# Patient Record
Sex: Female | Born: 1990 | Race: Black or African American | Hispanic: No | Marital: Single | Smoking: Current some day smoker
Health system: Southern US, Community
[De-identification: ages and names within clinical notes are randomized; demographics above are authoritative.]

## PROBLEM LIST (undated history)

## (undated) DIAGNOSIS — G43909 Migraine, unspecified, not intractable, without status migrainosus: Secondary | ICD-10-CM

---
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
## 2013-09-08 ENCOUNTER — Encounter (HOSPITAL_COMMUNITY): Payer: Self-pay | Admitting: Emergency Medicine

## 2013-09-08 ENCOUNTER — Emergency Department (HOSPITAL_COMMUNITY)
Admission: EM | Admit: 2013-09-08 | Discharge: 2013-09-08 | Disposition: A | Discharge status: Home / Self Care | Payer: Medicaid Other | Attending: Emergency Medicine | Admitting: Emergency Medicine

## 2013-09-08 DIAGNOSIS — G43909 Migraine, unspecified, not intractable, without status migrainosus: Secondary | ICD-10-CM | POA: Insufficient documentation

## 2013-09-08 HISTORY — DX: Migraine, unspecified, not intractable, without status migrainosus: G43.909

## 2013-09-08 LAB — I-STAT CHEM 8, ED
BUN: 4 mg/dL — AB (ref 6–23)
CALCIUM ION: 1.23 mmol/L (ref 1.12–1.23)
CREATININE: 0.7 mg/dL (ref 0.50–1.10)
Chloride: 102 mEq/L (ref 96–112)
GLUCOSE: 86 mg/dL (ref 70–99)
HCT: 44 % (ref 36.0–46.0)
HEMOGLOBIN: 15 g/dL (ref 12.0–15.0)
POTASSIUM: 4 meq/L (ref 3.7–5.3)
SODIUM: 140 meq/L (ref 137–147)
TCO2: 26 mmol/L (ref 0–100)

## 2013-09-08 MED ORDER — DIPHENHYDRAMINE HCL 50 MG/ML IJ SOLN
25.0000 mg | Freq: Once | INTRAMUSCULAR | Status: AC
  Administered 2013-09-08: 25 mg via INTRAVENOUS
  Filled 2013-09-08: qty 1

## 2013-09-08 MED ORDER — METOCLOPRAMIDE HCL 5 MG/ML IJ SOLN
10.0000 mg | Freq: Once | INTRAMUSCULAR | Status: AC
  Administered 2013-09-08: 10 mg via INTRAVENOUS
  Filled 2013-09-08: qty 2

## 2013-09-08 NOTE — ED Notes (Signed)
Pt c/o migraine lasting 3 days that is not improved with ibuprofen.  Pt states this is worse than her typical headache.  C/o vomiting, photophobia and blurred vision.

## 2013-09-08 NOTE — ED Provider Notes (Signed)
CSN: [id number]     Arrival date & time 09/08/13  1436 History   First MD Initiated Contact with Patient 09/08/13 1552     Chief Complaint  Patient presents with  . Migraine     (Consider location/radiation/quality/duration/timing/severity/associated sxs/prior Treatment) Patient is a 23 y.o. female presenting with migraines. The history is provided by the patient. No language interpreter was used.  Migraine This is a new problem. The current episode started in the past 7 days. The problem occurs constantly. The problem has been gradually worsening. Associated symptoms include nausea, a visual change and vomiting. She has tried NSAIDs for the symptoms. The treatment provided no relief.    Past Medical History  Diagnosis Date  . Migraines    History reviewed. No pertinent past surgical history. No family history on file. History  Substance Use Topics  . Smoking status: Never Smoker   . Smokeless tobacco: Not on file  . Alcohol Use: No   OB History   Grav Para Term Preterm Abortions TAB SAB Ect Mult Living                 Review of Systems  Eyes: Positive for photophobia.  Gastrointestinal: Positive for nausea and vomiting.  Musculoskeletal: Negative for neck stiffness.  All other systems reviewed and are negative.      Allergies  Review of patient's allergies indicates no known allergies.  Home Medications  No current outpatient prescriptions on file. BP 105/75  Pulse 91  Temp(Src) 98.1 F (36.7 C) (Oral)  Resp 18  Ht 5\' 6"  (1.676 m)  Wt 229 lb (103.874 kg)  BMI 36.98 kg/m2  SpO2 100%  LMP 08/12/2013 Physical Exam  Nursing note and vitals reviewed. Constitutional: She is oriented to person, place, and time. She appears well-nourished.  HENT:  Head: Normocephalic.  Eyes: EOM are normal. Pupils are equal, round, and reactive to light. Right eye exhibits no nystagmus. Left eye exhibits no nystagmus.  Neck: Normal range of motion.  Cardiovascular: Normal  rate, regular rhythm and normal heart sounds.   Pulmonary/Chest: Effort normal and breath sounds normal.  Abdominal: Soft. Bowel sounds are normal.  Musculoskeletal: Normal range of motion. She exhibits no edema and no tenderness.  Lymphadenopathy:    She has no cervical adenopathy.  Neurological: She is alert and oriented to person, place, and time. She has normal strength. No cranial nerve deficit or sensory deficit. Coordination normal. GCS eye subscore is 4. GCS verbal subscore is 5. GCS motor subscore is 6.    ED Course  Procedures (including critical care time) Labs Review Labs Reviewed  POC URINE PREG, ED  I-STAT CHEM 8, ED   Imaging Review No results found.   EKG Interpretation None     Headache resolved after medication.  No concerning neurologic findings. MDM   Final diagnoses:  None    Headache.    Jimmye Normanavid John Fallen Crisostomo, NP 09/09/13 [phone]

## 2013-09-08 NOTE — Discharge Instructions (Signed)
Migraine Headache °A migraine headache is an intense, throbbing pain on one or both sides of your head. A migraine can last for 30 minutes to several hours. °CAUSES  °The exact cause of a migraine headache is not always known. However, a migraine may be caused when nerves in the brain become irritated and release chemicals that cause inflammation. This causes pain. °Certain things may also trigger migraines, such as: °· Alcohol. °· Smoking. °· Stress. °· Menstruation. °· Aged cheeses. °· Foods or drinks that contain nitrates, glutamate, aspartame, or tyramine. °· Lack of sleep. °· Chocolate. °· Caffeine. °· Hunger. °· Physical exertion. °· Fatigue. °· Medicines used to treat chest pain (nitroglycerine), birth control pills, estrogen, and some blood pressure medicines. °SIGNS AND SYMPTOMS °· Pain on one or both sides of your head. °· Pulsating or throbbing pain. °· Severe pain that prevents daily activities. °· Pain that is aggravated by any physical activity. °· Nausea, vomiting, or both. °· Dizziness. °· Pain with exposure to bright lights, loud noises, or activity. °· General sensitivity to bright lights, loud noises, or smells. °Before you get a migraine, you may get warning signs that a migraine is coming (aura). An aura may include: °· Seeing flashing lights. °· Seeing bright spots, halos, or zig-zag lines. °· Having tunnel vision or blurred vision. °· Having feelings of numbness or tingling. °· Having trouble talking. °· Having muscle weakness. °DIAGNOSIS  °A migraine headache is often diagnosed based on: °· Symptoms. °· Physical exam. °· A CT scan or MRI of your head. These imaging tests cannot diagnose migraines, but they can help rule out other causes of headaches. °TREATMENT °Medicines may be given for pain and nausea. Medicines can also be given to help prevent recurrent migraines.  °HOME CARE INSTRUCTIONS °· Only take over-the-counter or prescription medicines for pain or discomfort as directed by your  health care provider. The use of long-term narcotics is not recommended. °· Lie down in a dark, quiet room when you have a migraine. °· Keep a journal to find out what may trigger your migraine headaches. For example, write down: °· What you eat and drink. °· How much sleep you get. °· Any change to your diet or medicines. °· Limit alcohol consumption. °· Quit smoking if you smoke. °· Get 7 9 hours of sleep, or as recommended by your health care provider. °· Limit stress. °· Keep lights dim if bright lights bother you and make your migraines worse. °SEEK IMMEDIATE MEDICAL CARE IF:  °· Your migraine becomes severe. °· You have a fever. °· You have a stiff neck. °· You have vision loss. °· You have muscular weakness or loss of muscle control. °· You start losing your balance or have trouble walking. °· You feel faint or pass out. °· You have severe symptoms that are different from your first symptoms. °MAKE SURE YOU:  °· Understand these instructions. °· Will watch your condition. °· Will get help right away if you are not doing well or get worse. °Document Released: 06/09/2005 Document Revised: 03/30/2013 Document Reviewed: 02/14/2013 °ExitCare® Patient Information ©2014 [employer]. ° ° °Emergency Department Resource Guide °1) Find a Doctor and Pay Out of Pocket °Although you won't have to find out who is covered by your insurance plan, it is a good idea to ask around and get recommendations. You will then need to call the office and see if the doctor you have chosen will accept you as a new patient and what types of options   they offer for patients who are self-pay. Some doctors offer discounts or will set up payment plans for their patients who do not have insurance, but you will need to ask so you aren't surprised when you get to your appointment. ° °2) Contact Your Local Health Department °Not all health departments have doctors that can see patients for sick visits, but many do, so it is worth a call to see if  yours does. If you don't know where your local health department is, you can check in your phone book. The CDC also has a tool to help you locate your state's health department, and many state websites also have listings of all of their local health departments. ° °3) Find a Walk-in Clinic °If your illness is not likely to be very severe or complicated, you may want to try a walk in clinic. These are popping up all over the country in pharmacies, drugstores, and shopping centers. They're usually staffed by nurse practitioners or physician assistants that have been trained to treat common illnesses and complaints. They're usually fairly quick and inexpensive. However, if you have serious medical issues or chronic medical problems, these are probably not your best option. ° °No Primary Care Doctor: °- Call [facility] at  832-8000 - they can help you locate a primary care doctor that  accepts your insurance, provides certain services, etc. °- Physician Referral Service- 1-800-533-3463 ° °Chronic Pain Problems: °Organization         Address  Phone   Notes  °[facility]  (336) 297-2271 Patients need to be referred by their primary care doctor.  ° °Medication Assistance: °Organization         Address  Phone   Notes  °[place] [facility] [address] °[place], [place] [address] (336) 641-8030 --Must be a resident of [place] °-- Must have NO insurance coverage whatsoever (no Medicaid/ Medicare, etc.) °-- The pt. MUST have a primary care doctor that directs their care regularly and follows them in the community °  °[facility]  (866) 331-1348   °[employer]  (888) 892-1162   ° °Agencies that provide inexpensive medical care: °Organization         Address  Phone   Notes  °[facility]  (336) 832-8035   °[facility]    (336) 832-7272   °[facility] [address] °[place], [place] [address] (336) 832-4777    °[facility] [address], °[place] (336) 271-4999   °[facility]    (336) 373-0678   °[facility]    (336) 272-1050   °[facility] ° [address], [place] Phone:  (336) 832-4444, Fax:  (336) 832-4440 Hours of Operation:  9 am - 6 pm, M-F.  Also accepts Medicaid/Medicare and self-pay.  °[facility] ° [address],  Phone: (336) 832-3150, Fax: (336) 832-3151. Hours of Operation:  8:30 am - 5:30 pm, M-F.  Also accepts Medicaid and self-pay.  °[facility] [place] [address], [place] Phone: (336) 878-6027   °[facility] [address], [place], [place] (336)723-1848, Ext. 123 Mondays & Thursdays: 7-9 AM.  First 15 patients are seen on a first come, first serve basis. °  ° °Medicaid-accepting [place] Providers: ° °Organization         Address  Phone   Notes  °[facility] [address], [place] (336) 641-2100 Also accepts self-pay patients.  °[facility] [address], [place] ° (336) 856-9996   °[facility] [address], [place] (336) 288-8857   °[facility] [address] [place] [address], [place] (336) 299-7000   °Veita Bland [address], [place]  ° (336) 373-1557 Only accepts [place] Access Medicaid patients after they have their name applied to their card.  ° °Self-Pay (no insurance) in [place]: ° °Organization         Address  Phone   Notes  °Sickle Cell Patients, [facility] [address], [place] (336) 832-1970   °[facility] [address], [place] (336) 832-4400   °[facility] ° [address], [place] (336) 992-4800   °[facility]/Dr. Osei-Bonsu ° [address] [place] [address], [place] or [address], [place] (336)  841-8500 Phone number for both [place] and [place] locations is the same.  °[facility] [address], [place] (336) 299-0000   °[facility] [address] [place] [address], [place] or [address] (336) 852-7530 °(336) 878-2260   °[facility] [address], [place] (336) 350-1642, phone; (336) 294-5005, fax Sees patients 1st and 3rd Saturday of every month.  Must not qualify for public or private insurance (i.e. Medicaid, Medicare, [place] Health Choice, Veterans' Benefits) • Household income should be no more than 200% of the poverty level •The clinic cannot treat you if you are pregnant or think you are pregnant • Sexually transmitted diseases are not treated at the clinic.  ° ° °Dental Care: °Organization         Address  Phone  Notes  °[facility] [address], [place] (336) 641-6152 Accepts children up to age 21 who are enrolled in Medicaid or [place] Health Choice; pregnant women with a Medicaid card; and children who have applied for Medicaid or [place] Health Choice, but were declined, whose parents can pay a reduced fee at time of service.  °[facility] [place]  [address], [place] (336) 641-7733 Accepts children up to age 21 who are enrolled in Medicaid or [place] Health Choice; pregnant women with a Medicaid card; and children who have applied for Medicaid or [place] Health Choice, but were declined, whose parents can pay a reduced fee at time of service.  °[facility] ° [address],  (336) 641-4533 Patients are seen by appointment only. Walk-ins are not accepted. [facility] will see patients 18 years of age and older. °Monday - Tuesday (8am-5pm) °Most Wednesdays (8:30-5pm) °$30 per visit, cash only  °[facility] ° [address], [place] (336) 641-4533 Patients are seen by  appointment only. Walk-ins are not accepted. [facility] will see patients 18 years of age and older. °One Wednesday Evening (Monthly: Volunteer Based).  $30 per visit, cash only  °[facility]  (919) 537-3737 for adults; Children under age 4, call Graduate Pediatric Dentistry at (919) [phone]. Children aged 4-14, please call (919) [phone] to request a pediatric application. ° Dental services are provided in all areas of dental care including fillings, crowns and bridges, complete and partial dentures,   implants, gum treatment, root canals, and extractions. Preventive care is also provided. Treatment is provided to both adults and children. °Patients are selected via a lottery and there is often a waiting list. °  °[facility] [address], °[place] ° (336) 763-8833 [email or web address] °  °[facility] [address], [place], [place] (336)723-1848, Ext. 123 Second and Fourth Thursday of each month, opens at 6:30 AM; Clinic ends at 9 AM.  Patients are seen on a first-come first-served basis, and a limited number are seen during each clinic.  ° °[facility] ° [address], [place], [place] (336) 723-7904   Eligibility Requirements °You must have lived in [place], [place], or [place] counties for at least the last three months. °  You cannot be eligible for state or federal sponsored healthcare insurance, including Veterans Administration, Medicaid, or Medicare. °  You generally cannot be eligible for healthcare insurance through your employer.  °  How to apply: °Eligibility screenings are held every Tuesday and Wednesday afternoon from 1:00 pm until 4:00 pm. You do not need an appointment for the interview!  °[facility] [address], [place], [place] 336-631-2330   °[facility]  336-342-8273   °[facility]  336-703-3100   °[facility]  336-570-6415    ° °Behavioral Health Resources in the Community: °Intensive Outpatient Programs °Organization         Address  Phone  Notes  °[facility] [address], [place], [place] 336-878-6098   °[facility] [address], [place], [place] 336-832-9800   °[facility] [address], [place], [place] ° 336-882-2125   °[facility] [address],  °[place], [place] 1-800-853-5163 or 336-641-4981   °Substance Abuse Resources °Organization         Address  Phone  Notes  °[facility]  336-882-2125   °[facility]  336-784-9470   °[facility]  336-285-9073   °[facility]  336-845-3988   °Residential & Outpatient Substance Abuse Program  1-800-659-3381   °Psychological Services °Organization         Address  Phone  Notes  °[facility]  336- 832-9600   °[facility]  336- 378-7881   °[facility] [address], [place] 1-800-853-5163 or 336-641-4981   ° °Mobile Crisis Teams °Organization         Address  Phone  Notes  °[facility], Mobile Crisis Care Unit  1-877-626-1772   °Assertive °[facility] ° [address] [place], [place] 336-834-9664   °Sharon DeEsch [address] °[place] 336-554-5454   ° °Self-Help/Support Groups °Organization         Address  Phone             Notes  °[facility] - variety of support groups  336- 373-1402 Call for more information  °Narcotics Anonymous (NA), [facility] [address], °[place]  2 meetings at this location  ° °Residential Treatment Programs °Organization         Address  Phone  Notes  °[facility] [address],    °[place]  1-866-801-8205   °[facility] ° [address], [place], [place] 704-293-8524   °[facility] [address], [place] 336-845-3988 Admissions: 8am-3pm M-F  °[facility] [address]    °[place], [place] 336-841-1104   °[facility] [address], [place], [place] 336-379-7146   °[facility] [address]  °[place], [place] 336-285-9073   °[facility] - Intensive Outpatient [address], [place], [place] 336-852-3033   °[facility] [address]  °[place], [place] 1-877-615-2722 or 336-784-9470   °[facility] [address] [place], [place] 336-227-7417 Accepts Medicaid  °[facility] [address]  °[place] 1-800-659-3381 Substance Abuse/Addiction Treatment  ° °[place] Behavioral Health Resources °Organization         Address  Phone  Notes  °[facility]  (888) 581-9988   °Julie Brannon, PhD [address] [place], [place]   (336) 349-5553 or (336) 951-0000   °[facility]   [address] °[place], [place] (336) 349-4454   °[facility] [address], [place], [place] (336) 342-8316 Insurance/Medicaid/sponsorship through [facility]  °[facility] [address]                                    [place], [place] (336) 342-8316 Therapy/tele-psych/case  °[facility] [address]  ° [place], [place] (336) 349-2233    °Dr. Arfeen  (336) 349-4544   °[facility] [place]  [employer] [place] [facility] 1) [address], [place] °2) [address], [place] °3)  [address], [place] (336) 349-3220 °(336) 342-7768 ° °(336) 342-8140   °[place] [facility] (336) 342-1394 or (336) 342-3537 (After Hours)    ° ° ° ° °

## 2013-09-17 NOTE — ED Provider Notes (Signed)
Medical screening examination/treatment/procedure(s) were performed by non-physician practitioner and as supervising physician I was immediately available for consultation/collaboration.   EKG Interpretation None        Gavin PoundMichael Y. Oletta LamasGhim, MD 09/17/13 [id number]

## 2017-11-17 ENCOUNTER — Telehealth (HOSPITAL_BASED_OUTPATIENT_CLINIC_OR_DEPARTMENT_OTHER): Payer: Self-pay | Admitting: *Deleted

## 2017-11-17 ENCOUNTER — Emergency Department (HOSPITAL_BASED_OUTPATIENT_CLINIC_OR_DEPARTMENT_OTHER)
Admission: EM | Admit: 2017-11-17 | Discharge: 2017-11-17 | Disposition: A | Discharge status: Home / Self Care | Payer: Medicaid Other | Attending: Emergency Medicine | Admitting: Emergency Medicine

## 2017-11-17 ENCOUNTER — Encounter (HOSPITAL_BASED_OUTPATIENT_CLINIC_OR_DEPARTMENT_OTHER): Payer: Self-pay

## 2017-11-17 ENCOUNTER — Other Ambulatory Visit: Payer: Self-pay

## 2017-11-17 DIAGNOSIS — Z5321 Procedure and treatment not carried out due to patient leaving prior to being seen by health care provider: Secondary | ICD-10-CM | POA: Insufficient documentation

## 2017-11-17 DIAGNOSIS — O9989 Other specified diseases and conditions complicating pregnancy, childbirth and the puerperium: Secondary | ICD-10-CM | POA: Insufficient documentation

## 2017-11-17 DIAGNOSIS — R1030 Lower abdominal pain, unspecified: Secondary | ICD-10-CM | POA: Insufficient documentation

## 2017-11-17 LAB — URINALYSIS, MICROSCOPIC (REFLEX)

## 2017-11-17 LAB — URINALYSIS, ROUTINE W REFLEX MICROSCOPIC
Bilirubin Urine: NEGATIVE
Glucose, UA: NEGATIVE mg/dL
Hgb urine dipstick: NEGATIVE
Ketones, ur: 15 mg/dL — AB
Nitrite: NEGATIVE
PROTEIN: NEGATIVE mg/dL
pH: 6 (ref 5.0–8.0)

## 2017-11-17 LAB — PREGNANCY, URINE: PREG TEST UR: POSITIVE — AB

## 2017-11-17 NOTE — ED Notes (Signed)
Pt not located in room; gown is on bed; no pt belongings noted.

## 2017-11-17 NOTE — ED Provider Notes (Signed)
Patient presents to the ED for evaluation of lower abdominal pain.  Patient reports history of pregnancy approximately 7-[redacted] weeks pregnant.  Patient had lab work done in triage that showed a urinary tract infection and trichomonas in her urine.  I reviewed patient's prior medical record and she was seen last week at [facility] ER and given Keflex at that time.  I went to examine patient and she was not in the room.  Her gown was removed and appears the patient has left.  She had deformity by that she was leaving.  I will notify charge nurse to call patient to tell her that her urine was abnormal with trichomonas and that she needs to see an OB/GYN doctor to have this treated.  As this can be harmful to a pregnancy.  Patient will be encouraged to follow-up with a OB/GYN and return the ED with any worsening symptoms.  I was not able to evaluate patient.   Rise Mu, PA-C 11/17/17 1446    Rolan Bucco, MD 11/17/17 [phone]

## 2017-11-17 NOTE — ED Triage Notes (Signed)
C/o mid lower abd pain since 8am-denies n/v/d, urinary sx and vaginal d/c-NAD-steady gait

## 2018-11-09 ENCOUNTER — Telehealth: Payer: Medicaid Other | Admitting: Physician Assistant

## 2018-11-09 DIAGNOSIS — J029 Acute pharyngitis, unspecified: Secondary | ICD-10-CM | POA: Diagnosis not present

## 2018-11-09 DIAGNOSIS — M791 Myalgia, unspecified site: Secondary | ICD-10-CM

## 2018-11-09 NOTE — Progress Notes (Signed)
E-Visit for Corona Virus Screening  Based on your current symptoms, you may very well have the virus, however your symptoms are mild. Currently, not all patients are being tested. If the symptoms are mild and there is not a known exposure, performing the test is not indicated.  You have been enrolled in MyChart Home Monitoring for COVID-19. Daily you will receive a questionnaire within the MyChart website. Our COVID-19 response team will be monitoring your responses daily.   Coronavirus disease 2019 (COVID-19)is a respiratory illness that can spread from person to person. The virus that causes COVID-19 is a new virus that was first identified in the country of [place] but is now found in multiple other countries and has spread to the [place].  Symptoms associated with the virus are mild to severe fever, cough, and shortness of breath. There is currently no vaccine to protect against COVID-19, and there is no specific antiviral treatment for the virus.   To be considered HIGH RISK for Coronavirus (COVID-19), you have to meet the following criteria:  . Traveled to [place], [place], [place], [place] or [place]; or in the [place] to [place], [place], [place], or [place]; and have fever, cough, and shortness of breath within the last 2 weeks of travel OR  . Been in close contact with a person diagnosed with COVID-19 within the last 2 weeks and have fever, cough, and shortness of breath  . IF YOU DO NOT MEET THESE CRITERIA, YOU ARE CONSIDERED LOW RISK FOR COVID-19.   It is vitally important that if you feel that you have an infection such as this virus or any other virus that you stay home and away from places where you may spread it to others.  You should self-quarantine for 14 days if you have symptoms that could potentially be coronavirus and avoid contact with people age 43 and older.     You may also take acetaminophen (Tylenol) 1000 mg every 8 hours as needed for fever.   Reduce  your risk of any infection by using the same precautions used for avoiding the common cold or flu: Wash your hands often with soap and warm water for at least 20 seconds.  If soap and water are not readily available, use an alcohol-based hand sanitizer with at least 60% alcohol.  If coughing or sneezing, cover your mouth and nose by coughing or sneezing into the elbow areas of your shirt or coat, into a tissue or into your sleeve (not your hands). Avoid shaking hands with others and consider head nods or verbal greetings only.  Avoid touching your eyes,nose, or mouth with unwashed hands. Avoid close contact with people who are sick. Avoid places or events with large numbers of people in one location, like concerts or sporting events. Carefully consider travel plans you have or are making. If you are planning any travel outside or inside the [place], visit the CDC'sTravelers' Health webpagefor the latest health notices. If you have some symptoms but not all symptoms, continue to monitor at home and seek medical attention if your symptoms worsen. If you are having a medical emergency, call 911.  HOME CARE Only take medications as instructed by your medical team. Drink plenty of fluids and get plenty of rest. A steam or ultrasonic humidifier can help if you have congestion.   GET HELP RIGHT AWAY IF: You develop worsening fever. You become short of breath You cough up blood. Your symptoms become more severe MAKE SURE YOU  Understand these instructions. Will watch your condition. Will get help right away if you are not doing well or get worse.  Your e-visit answers were reviewed by a board certified advanced clinical practitioner to complete your personal care plan.  Depending on the condition, your plan could have included both over the counter or prescription medications.  If there is a problem please reply once you have received a response from your provider. Your safety is important to [place].   If you have drug allergies check your prescription carefully.    You can use MyChart to ask questions about today's visit, request a non-urgent call back, or ask for a work or school excuse for 24 hours related to this e-Visit. If it has been greater than 24 hours you will need to follow up with your provider, or enter a new e-Visit to address those concerns. You will get an e-mail in the next two days asking about your experience.  I hope that your e-visit has been valuable and will speed your recovery. Thank you for using e-visits.    ===View-only below this line===   ----- Message -----    From: [name]    Sent: 11/09/2018 10:48 AM EDT      To: E-Visit Mailing List Subject: E-Visit Submission: Flu Like Symptoms  E-Visit Submission: Flu Like Symptoms --------------------------------  Question: How long have you had flu like symptoms? Answer:   less than 48 hours  Question: Are you having any body aches or pain such as Answer:   Muscle pain            None  Question: Do you have a cough or sore throat? Answer:   I have a sore throat  Question: Are you in close contact with anyone who has similar symptoms ? Answer:   No  Question: Do you have a fever? Answer:   Yes, I have a low fever (lower than 101 degrees)  Question: Describe your sore throat: Answer:   Scratxhy  Question: How long have you had a sore throat? Answer:   1 day  Question: Do you have any tenderness or swelling in your neck? Answer:   No  Question: Are you short of breath? Answer:   No  Question: Do you have constant pain in your chest or abdomen? Answer:   No  Question: Are you able to keep liquids down? Answer:   Yes  Question: Have you experienced a seizure or loss of consciousness? Answer:   No  Question: Do you have a headache? Answer:   Yes  Question: Is there a rash? Answer:   No  Question: Are you over the age of 23? Answer:   No  Question: Are you treated for any of the  following conditions: Asthma, COPD, diabetes, renal failure on dialysis, AIDS, any neuromuscular disease that effects the clearing of secretions heart failure or heart disease? Answer:   No  Question: Have you received recent chemotherapy or are you taking a drug that may reduce your ability to fight infections for psoriasis, arthritis, or any other autoimmune condition? Answer:   No  Question: Do you have close contact with anyone who has been diagnosed with any of the conditions listed above? Answer:   Yes  Question: Are you pregnant? Answer:   I am confident that I am not pregnant  Question: Are you breastfeeding? Answer:   No  Question: Are your symptoms severe enough that you think or someone has told you that you  need to see a physician urgently? Answer:   No  Question: Are you allergic to Zanamivir or Oseltamivir (Tamiflu)? Answer:   No  Question: Are there children in your family or household that are under the age of 5? Answer:   Yes  Question: Please list your medication allergies that you may have ? (If 'none' , please list as 'none') Answer:   N/a  Question: Please list any additional comments  Answer:     A total of 5-10 minutes was spent evaluating this patients questionnaire and formulating a plan of care.

## 2019-08-11 ENCOUNTER — Encounter (HOSPITAL_BASED_OUTPATIENT_CLINIC_OR_DEPARTMENT_OTHER): Payer: Self-pay | Admitting: *Deleted

## 2019-08-11 ENCOUNTER — Other Ambulatory Visit: Payer: Self-pay

## 2019-08-11 ENCOUNTER — Emergency Department (HOSPITAL_BASED_OUTPATIENT_CLINIC_OR_DEPARTMENT_OTHER)
Admission: EM | Admit: 2019-08-11 | Discharge: 2019-08-11 | Disposition: A | Discharge status: Home / Self Care | Payer: Medicaid Other | Attending: Emergency Medicine | Admitting: Emergency Medicine

## 2019-08-11 DIAGNOSIS — Z3A01 Less than 8 weeks gestation of pregnancy: Secondary | ICD-10-CM | POA: Diagnosis not present

## 2019-08-11 DIAGNOSIS — R519 Headache, unspecified: Secondary | ICD-10-CM | POA: Insufficient documentation

## 2019-08-11 DIAGNOSIS — O219 Vomiting of pregnancy, unspecified: Secondary | ICD-10-CM | POA: Diagnosis not present

## 2019-08-11 DIAGNOSIS — R509 Fever, unspecified: Secondary | ICD-10-CM | POA: Diagnosis not present

## 2019-08-11 DIAGNOSIS — O26891 Other specified pregnancy related conditions, first trimester: Secondary | ICD-10-CM | POA: Insufficient documentation

## 2019-08-11 DIAGNOSIS — Z3201 Encounter for pregnancy test, result positive: Secondary | ICD-10-CM

## 2019-08-11 DIAGNOSIS — R110 Nausea: Secondary | ICD-10-CM

## 2019-08-11 LAB — COMPREHENSIVE METABOLIC PANEL
ALT: 15 U/L (ref 0–44)
AST: 17 U/L (ref 15–41)
Albumin: 3.9 g/dL (ref 3.5–5.0)
Alkaline Phosphatase: 75 U/L (ref 38–126)
Anion gap: 8 (ref 5–15)
BUN: 7 mg/dL (ref 6–20)
CO2: 24 mmol/L (ref 22–32)
Calcium: 9.2 mg/dL (ref 8.9–10.3)
Chloride: 101 mmol/L (ref 98–111)
Creatinine, Ser: 0.57 mg/dL (ref 0.44–1.00)
GFR calc Af Amer: >60 mL/min (ref >60)
GFR calc non Af Amer: >60 mL/min (ref >60)
Glucose, Bld: 86 mg/dL (ref 70–99)
Potassium: 3.6 mmol/L (ref 3.5–5.1)
Sodium: 133 mmol/L — ABNORMAL LOW (ref 135–145)
Total Bilirubin: 0.9 mg/dL (ref 0.3–1.2)
Total Protein: 7.7 g/dL (ref 6.5–8.1)

## 2019-08-11 LAB — CBC WITH DIFFERENTIAL/PLATELET
Abs Immature Granulocytes: 0.03 10*3/uL (ref 0.00–0.07)
Basophils Absolute: 0.1 10*3/uL (ref 0.0–0.1)
Basophils Relative: 1 %
Eosinophils Absolute: 0.2 10*3/uL (ref 0.0–0.5)
Eosinophils Relative: 2 %
HCT: 43.6 % (ref 36.0–46.0)
Hemoglobin: 13.5 g/dL (ref 12.0–15.0)
Immature Granulocytes: 0 %
Lymphocytes Relative: 18 %
Lymphs Abs: 2 10*3/uL (ref 0.7–4.0)
MCH: 26 pg (ref 26.0–34.0)
MCHC: 31 g/dL (ref 30.0–36.0)
MCV: 83.8 fL (ref 80.0–100.0)
Monocytes Absolute: 0.7 10*3/uL (ref 0.1–1.0)
Monocytes Relative: 7 %
Neutro Abs: 8.1 10*3/uL — ABNORMAL HIGH (ref 1.7–7.7)
Neutrophils Relative %: 72 %
Platelets: 354 10*3/uL (ref 150–400)
RBC: 5.2 MIL/uL — ABNORMAL HIGH (ref 3.87–5.11)
RDW: 14.8 % (ref 11.5–15.5)
WBC: 11 10*3/uL — ABNORMAL HIGH (ref 4.0–10.5)
nRBC: 0 % (ref 0.0–0.2)

## 2019-08-11 LAB — URINALYSIS, ROUTINE W REFLEX MICROSCOPIC
Bilirubin Urine: NEGATIVE
Glucose, UA: NEGATIVE mg/dL
Hgb urine dipstick: NEGATIVE
Ketones, ur: 40 mg/dL — AB
Leukocytes,Ua: NEGATIVE
Nitrite: NEGATIVE
Protein, ur: NEGATIVE mg/dL
Specific Gravity, Urine: >1.03 — ABNORMAL HIGH (ref 1.005–1.030)
pH: 6 (ref 5.0–8.0)

## 2019-08-11 LAB — LIPASE, BLOOD: Lipase: 17 U/L (ref 11–51)

## 2019-08-11 LAB — PREGNANCY, URINE: Preg Test, Ur: POSITIVE — AB

## 2019-08-11 LAB — HCG, QUANTITATIVE, PREGNANCY: hCG, Beta Chain, Quant, S: 147816 m[IU]/mL — ABNORMAL HIGH (ref <5)

## 2019-08-11 MED ORDER — ONDANSETRON 4 MG PO TBDP: 4.0000 mg | ORAL_TABLET | Freq: Three times a day (TID) | ORAL | 0 refills | Status: DC | PRN

## 2019-08-11 MED ORDER — SODIUM CHLORIDE 0.9 % IV BOLUS
1000.0000 mL | Freq: Once | INTRAVENOUS | Status: AC
  Administered 2019-08-11: 1000 mL via INTRAVENOUS

## 2019-08-11 MED ORDER — ONDANSETRON HCL 4 MG/2ML IJ SOLN
4.0000 mg | Freq: Once | INTRAMUSCULAR | Status: AC
  Administered 2019-08-11: 4 mg via INTRAVENOUS
  Filled 2019-08-11: qty 2

## 2019-08-11 NOTE — ED Provider Notes (Signed)
[facility] EMERGENCY DEPARTMENT Provider Note   CSN: [id number] Arrival date & time: 08/11/19  1734     History Chief Complaint  Patient presents with  . Nausea    Jenna Knight is a 29 y.o. female has no history of migraines who presents for evaluation of nausea/vomiting that has been ongoing for the last 4 days.  She states she has had intermittent vomiting.  No blood noted in vomit.  She states she really is not been able to tolerate much p.o.  She states she has not had any associated dental pain or diarrhea.  She states her temperature today was 99.9 but she has not measured any fever higher than 100.4.  She states that she has not had any recent travel or known COVID-19 exposure.  She has not had any chest pain, difficulty breathing, dysuria, hematuria.  She is currently sexually active with 1 partner.  They do not use protection.  The history is provided by the patient.       Past Medical History:  Diagnosis Date  . Migraines     There are no problems to display for this patient.   History reviewed. No pertinent surgical history.   OB History    Gravida  1   Para      Term      Preterm      AB      Living        SAB      TAB      Ectopic      Multiple      Live Births              No family history on file.  Social History   Tobacco Use  . Smoking status: Never Smoker  . Smokeless tobacco: Never Used  Substance Use Topics  . Alcohol use: No  . Drug use: No    Home Medications Prior to Admission medications   Not on File    Allergies    Patient has no known allergies.  Review of Systems   Review of Systems  Constitutional: Negative for fever.  Respiratory: Negative for cough and shortness of breath.   Cardiovascular: Negative for chest pain.  Gastrointestinal: Positive for nausea. Negative for abdominal pain and vomiting.  Genitourinary: Negative for dysuria and hematuria.  Neurological: Negative for headaches.    All other systems reviewed and are negative.   Physical Exam Updated Vital Signs BP 117/90 (BP Location: Right Arm)   Pulse 69   Temp 98.3 F (36.8 C) (Oral)   Resp 16   Ht 5\' 6"  (1.676 m)   Wt 103 kg   LMP 07/23/2019   SpO2 100%   Breastfeeding Unknown   BMI 36.64 kg/m   Physical Exam Vitals and nursing note reviewed.  Constitutional:      Appearance: Normal appearance. She is well-developed.     Comments: Sitting comfortably on examination table.   HENT:     Head: Normocephalic and atraumatic.  Eyes:     General: Lids are normal.     Conjunctiva/sclera: Conjunctivae normal.     Pupils: Pupils are equal, round, and reactive to light.  Cardiovascular:     Rate and Rhythm: Normal rate and regular rhythm.     Pulses: Normal pulses.     Heart sounds: Normal heart sounds. No murmur. No friction rub. No gallop.   Pulmonary:     Effort: Pulmonary effort is normal.  Breath sounds: Normal breath sounds.  Abdominal:     Palpations: Abdomen is soft. Abdomen is not rigid.     Tenderness: There is no abdominal tenderness. There is no guarding.     Comments: Abdomen is soft, non-distended, non-tender. No rigidity, No guarding. No peritoneal signs.  Musculoskeletal:        General: Normal range of motion.     Cervical back: Full passive range of motion without pain.  Skin:    General: Skin is warm and dry.     Capillary Refill: Capillary refill takes less than 2 seconds.  Neurological:     Mental Status: She is alert and oriented to person, place, and time.  Psychiatric:        Speech: Speech normal.     ED Results / Procedures / Treatments   Labs (all labs ordered are listed, but only abnormal results are displayed) Labs Reviewed  URINALYSIS, ROUTINE W REFLEX MICROSCOPIC - Abnormal; Notable for the following components:      Result Value   Specific Gravity, Urine >1.030 (*)    Ketones, ur 40 (*)    All other components within normal limits  PREGNANCY, URINE -  Abnormal; Notable for the following components:   Preg Test, Ur POSITIVE (*)    All other components within normal limits  COMPREHENSIVE METABOLIC PANEL - Abnormal; Notable for the following components:   Sodium 133 (*)    All other components within normal limits  CBC WITH DIFFERENTIAL/PLATELET - Abnormal; Notable for the following components:   WBC 11.0 (*)    RBC 5.20 (*)    Neutro Abs 8.1 (*)    All other components within normal limits  HCG, QUANTITATIVE, PREGNANCY - Abnormal; Notable for the following components:   hCG, Beta Chain, Quant, S [id number] (*)    All other components within normal limits  LIPASE, BLOOD    EKG None  Radiology No results found.  Procedures Procedures (including critical care time)  Medications Ordered in ED Medications  sodium chloride 0.9 % bolus 1,000 mL (0 mLs Intravenous Stopped 08/11/19 1946)  ondansetron (ZOFRAN) injection 4 mg (4 mg Intravenous Given 08/11/19 1818)    ED Course  I have reviewed the triage vital signs and the nursing notes.  Pertinent labs & imaging results that were available during my care of the patient were reviewed by me and considered in my medical decision making (see chart for details).    MDM Rules/Calculators/A&P                      29 year old female who presents for evaluation of nausea that has been ongoing for the last 4 days.  States she has not been able to tolerate much p.o.  No blood noted in emesis.  States her temperature today was 99.9.  No fevers above 100.4.  On initial ED arrival, she is afebrile, nontoxic-appearing.  Vital signs are stable.  No abdominal pain.  Benign abdominal exam.  We will plan to check labs.  Lipase unremarkable.  CBC shows slight leukocytosis of 11.0.  Hemoglobin stable.  CMP unremarkable.  Urine pregnancy is positive.  UA negative for any signs of infectious etiology.  I discussed results with patient.  She estimates that her last menstrual cycle was 07/08/2019 which would  put her approximately 4 weeks and 6 days.  We will add on hCG quant.  hCG quant shows 147, 816.   Reevaluation.  Patient resting comfortably.  She is able  to tolerate p.o. without any difficulty.  She reports improvement in nausea.  Patient has no abdominal pain at this time.  No indication for ultrasound imaging.  I instructed patient that she needs to follow-up with her OB/GYN regarding her prior positive pregnancy test today. At this time, patient exhibits no emergent life-threatening condition that require further evaluation in ED or admission. Patient had ample opportunity for questions and discussion. All patient's questions were answered with full understanding. Strict return precautions discussed. Patient expresses understanding and agreement to plan.   Portions of this note were generated with Scientist, clinical (histocompatibility and immunogenetics). Dictation errors may occur despite best attempts at proofreading.  Final Clinical Impression(s) / ED Diagnoses Final diagnoses:  Nausea  Positive pregnancy test    Rx / DC Orders ED Discharge Orders    None       Rosana Hoes 08/11/19 2018    Jacalyn Lefevre, MD 08/11/19 2042

## 2019-08-11 NOTE — ED Triage Notes (Signed)
Fever this am.  Nausea, vomiting x 2 days.  Light headed this am.

## 2019-08-11 NOTE — ED Notes (Signed)
Presents with persistent nausea, unable to relieve nausea, having HA as well since Monday, states has been vomiting today. States had a fever this am. (VS reviewed from triage). Denies currently having any abdominal pain, denies any diarrhea. States works from home, so little chance of COVID exposure.

## 2019-08-11 NOTE — Discharge Instructions (Signed)
Follow-up with your OB/GYN.  Make sure you are staying hydrated drink plenty of fluids.  Return the emergency department for any abdominal pain, vomiting, fevers or any other worsening or concerning symptoms.

## 2019-09-20 ENCOUNTER — Emergency Department (HOSPITAL_COMMUNITY)
Admission: EM | Admit: 2019-09-20 | Discharge: 2019-09-20 | Discharge status: Against Medical Advice | Payer: Medicaid Other

## 2019-09-20 ENCOUNTER — Other Ambulatory Visit: Payer: Self-pay

## 2019-11-01 ENCOUNTER — Emergency Department (HOSPITAL_BASED_OUTPATIENT_CLINIC_OR_DEPARTMENT_OTHER)
Admission: EM | Admit: 2019-11-01 | Discharge: 2019-11-01 | Disposition: A | Discharge status: Home / Self Care | Payer: Medicaid Other | Attending: Emergency Medicine | Admitting: Emergency Medicine

## 2019-11-01 ENCOUNTER — Other Ambulatory Visit: Payer: Self-pay

## 2019-11-01 ENCOUNTER — Encounter (HOSPITAL_BASED_OUTPATIENT_CLINIC_OR_DEPARTMENT_OTHER): Payer: Self-pay | Admitting: Emergency Medicine

## 2019-11-01 DIAGNOSIS — J029 Acute pharyngitis, unspecified: Secondary | ICD-10-CM | POA: Insufficient documentation

## 2019-11-01 DIAGNOSIS — Z20822 Contact with and (suspected) exposure to covid-19: Secondary | ICD-10-CM | POA: Diagnosis not present

## 2019-11-01 DIAGNOSIS — R05 Cough: Secondary | ICD-10-CM | POA: Diagnosis not present

## 2019-11-01 DIAGNOSIS — R059 Cough, unspecified: Secondary | ICD-10-CM

## 2019-11-01 LAB — SARS CORONAVIRUS 2 (TAT 6-24 HRS): SARS Coronavirus 2: NEGATIVE

## 2019-11-01 NOTE — ED Notes (Signed)
ED Provider at bedside. 

## 2019-11-01 NOTE — ED Provider Notes (Signed)
[facility] EMERGENCY DEPARTMENT Provider Note   CSN: [id number] Arrival date & time: 11/01/19  1607     History Chief Complaint  Patient presents with  . Cough    Jenna Knight is a 29 y.o. female.  29yo F w/ PMH including migraines who p/w cough. Today at work she began coughing and eventually coughed so much that it made her vomit. She has had scratchy throat, no nasal congestion or loss of taste/smell. She reports chills but no fevers, headaches, diarrhea, urinary symptoms, or body aches. She notes she was around a friend 2 days ago who has COVID-19. No medications PTA.   The history is provided by the patient.  Cough      Past Medical History:  Diagnosis Date  . Migraines     There are no problems to display for this patient.   History reviewed. No pertinent surgical history.   OB History    Gravida  1   Para      Term      Preterm      AB      Living        SAB      TAB      Ectopic      Multiple      Live Births              No family history on file.  Social History   Tobacco Use  . Smoking status: Never Smoker  . Smokeless tobacco: Never Used  Substance Use Topics  . Alcohol use: No  . Drug use: No    Home Medications Prior to Admission medications   Not on File    Allergies    Patient has no known allergies.  Review of Systems   Review of Systems  Respiratory: Positive for cough.   All other systems reviewed and are negative except that which was mentioned in HPI   Physical Exam Updated Vital Signs BP 103/71 (BP Location: Right Arm)   Pulse 91   Temp 98.6 F (37 C) (Oral)   Resp 16   Ht 5\' 6"  (1.676 m)   Wt 103 kg   SpO2 100%   BMI 36.64 kg/m   Physical Exam Vitals and nursing note reviewed.  Constitutional:      General: She is not in acute distress.    Appearance: She is well-developed.  HENT:     Head: Normocephalic and atraumatic.  Eyes:     Conjunctiva/sclera: Conjunctivae normal.   Cardiovascular:     Rate and Rhythm: Normal rate and regular rhythm.     Heart sounds: Normal heart sounds. No murmur.  Pulmonary:     Effort: Pulmonary effort is normal.     Breath sounds: Normal breath sounds. No wheezing.  Abdominal:     General: Bowel sounds are normal. There is no distension.     Palpations: Abdomen is soft.     Tenderness: There is no abdominal tenderness.  Musculoskeletal:     Cervical back: Neck supple.  Skin:    General: Skin is warm and dry.  Neurological:     Mental Status: She is alert and oriented to person, place, and time.     Comments: Fluent speech  Psychiatric:        Judgment: Judgment normal.     ED Results / Procedures / Treatments   Labs (all labs ordered are listed, but only abnormal results are displayed) Labs Reviewed  SARS CORONAVIRUS 2 (  TAT 6-24 HRS)    EKG None  Radiology No results found.  Procedures Procedures (including critical care time)  Medications Ordered in ED Medications - No data to display  ED Course  I have reviewed the triage vital signs and the nursing notes.     MDM Rules/Calculators/A&P                       Well-appearing and comfortable on exam with normal vital signs.  O2 saturation 100% on room air and normal work of breathing.  Discussed possibility of viral URI, allergies, or COVID-19.  Recommended COVID-19 testing.  Discussed what to do based on results and need for strict quarantine until results are available.  Reviewed return precautions.  She voiced understanding.  Jenna Knight was evaluated in Emergency Department on 11/01/2019 for the symptoms described in the history of present illness. She was evaluated in the context of the global COVID-19 pandemic, which necessitated consideration that the patient might be at risk for infection with the SARS-CoV-2 virus that causes COVID-19. Institutional protocols and algorithms that pertain to the evaluation of patients at risk for COVID-19 are in a  state of rapid change based on information released by regulatory bodies including the CDC and federal and state organizations. These policies and algorithms were followed during the patient's care in the ED.  Final Clinical Impression(s) / ED Diagnoses Final diagnoses:  Cough  Person under investigation for COVID-19    Rx / DC Orders ED Discharge Orders    None       Dayln Tugwell, Wenda Overland, MD 11/01/19 1035

## 2019-11-01 NOTE — ED Triage Notes (Addendum)
Throat has been scratchy since yesterday and today she coughed so hard she vomited x 1 ,  States friend has COVID and was around her 2 days ago

## 2019-12-29 ENCOUNTER — Encounter (HOSPITAL_BASED_OUTPATIENT_CLINIC_OR_DEPARTMENT_OTHER): Payer: Self-pay | Admitting: Emergency Medicine

## 2019-12-29 ENCOUNTER — Other Ambulatory Visit: Payer: Self-pay

## 2019-12-29 ENCOUNTER — Emergency Department (HOSPITAL_BASED_OUTPATIENT_CLINIC_OR_DEPARTMENT_OTHER)
Admission: EM | Admit: 2019-12-29 | Discharge: 2019-12-29 | Disposition: A | Discharge status: Home / Self Care | Payer: Medicaid Other | Attending: Emergency Medicine | Admitting: Emergency Medicine

## 2019-12-29 DIAGNOSIS — Z79899 Other long term (current) drug therapy: Secondary | ICD-10-CM | POA: Diagnosis not present

## 2019-12-29 DIAGNOSIS — R21 Rash and other nonspecific skin eruption: Secondary | ICD-10-CM

## 2019-12-29 MED ORDER — HYDROXYZINE HCL 25 MG PO TABS: 25.0000 mg | ORAL_TABLET | Freq: Three times a day (TID) | ORAL | 0 refills | Status: AC | PRN

## 2019-12-29 NOTE — ED Provider Notes (Signed)
[facility] EMERGENCY DEPARTMENT Provider Note   CSN: [id number] Arrival date & time: 12/29/19  1955     History Chief Complaint  Patient presents with  . Rash    Jenna Knight is a 29 y.o. female.  The history is provided by the patient.  Rash Location:  Full body Quality: itchiness   Severity:  Mild Onset quality:  Gradual Timing:  Intermittent Progression:  Waxing and waning Chronicity:  New Context comment:  Unknown why hives, no obivous exposure. Relieved by:  Antihistamines Worsened by:  Nothing Associated symptoms: no abdominal pain, no fatigue, no headaches, no nausea, no shortness of breath, no throat swelling, no tongue swelling, not vomiting and not wheezing        Past Medical History:  Diagnosis Date  . Migraines     There are no problems to display for this patient.   History reviewed. No pertinent surgical history.   OB History    Gravida  1   Para      Term      Preterm      AB      Living        SAB      TAB      Ectopic      Multiple      Live Births              No family history on file.  Social History   Tobacco Use  . Smoking status: Never Smoker  . Smokeless tobacco: Never Used  Substance Use Topics  . Alcohol use: No  . Drug use: No    Home Medications Prior to Admission medications   Medication Sig Start Date End Date Taking? Authorizing Provider  hydrOXYzine (ATARAX/VISTARIL) 25 MG tablet Take 1 tablet (25 mg total) by mouth every 8 (eight) hours as needed for up to 15 doses for itching. 12/29/19   Virgina Norfolk, DO    Allergies    Patient has no known allergies.  Review of Systems   Review of Systems  Constitutional: Negative for fatigue.  Respiratory: Negative for shortness of breath and wheezing.   Gastrointestinal: Negative for abdominal pain, nausea and vomiting.  Skin: Positive for rash.  Neurological: Negative for headaches.    Physical Exam Updated Vital Signs BP 116/81  (BP Location: Right Arm)   Pulse 97   Temp 98.5 F (36.9 C) (Oral)   Resp 16   Ht 5\' 6"  (1.676 m)   Wt 103 kg   SpO2 100%   BMI 36.64 kg/m   Physical Exam Constitutional:      General: She is not in acute distress.    Appearance: She is not ill-appearing.  HENT:     Mouth/Throat:     Mouth: Mucous membranes are moist.     Pharynx: No oropharyngeal exudate or posterior oropharyngeal erythema.  Cardiovascular:     Pulses: Normal pulses.  Pulmonary:     Effort: Pulmonary effort is normal.  Skin:    Capillary Refill: Capillary refill takes less than 2 seconds.     Findings: No rash.  Neurological:     Mental Status: She is alert.     ED Results / Procedures / Treatments   Labs (all labs ordered are listed, but only abnormal results are displayed) Labs Reviewed - No data to display  EKG None  Radiology No results found.  Procedures Procedures (including critical care time)  Medications Ordered in ED Medications - No data to  display  ED Course  I have reviewed the triage vital signs and the nursing notes.  Pertinent labs & imaging results that were available during my care of the patient were reviewed by me and considered in my medical decision making (see chart for details).    MDM Rules/Calculators/A&P                          Jenna Knight is a 29 year old female who presents the ED with rash.  Patient normal vitals.  No fever.  Intermittent rash on and off for the last 2 days.  Response to Benadryl.  She broke out in diffuse rash just prior to arrival but improved after Benadryl.  She no longer has rash.  No obvious exposures.  No concern for anaphylaxis.  No history of allergies in the past.  Recommend continued use of antihistamines.  Recommend follow-up with primary care doctor.  Discharged from the ED in good condition.  This chart was dictated using voice recognition software.  Despite best efforts to proofread,  errors can occur which can change the  documentation meaning.   Final Clinical Impression(s) / ED Diagnoses Final diagnoses:  Rash    Rx / DC Orders ED Discharge Orders         Ordered    hydrOXYzine (ATARAX/VISTARIL) 25 MG tablet  Every 8 hours PRN     Discontinue  Reprint     12/29/19 2023           Virgina Norfolk, DO 12/29/19 2026

## 2019-12-29 NOTE — ED Triage Notes (Signed)
Pt reports intermittent rash diffusely and cough x 2 days.

## 2019-12-29 NOTE — Discharge Instructions (Addendum)
Follow-up with your primary care doctor.  Take Vistaril or Benadryl as needed for rash.

## 2020-01-23 ENCOUNTER — Emergency Department (HOSPITAL_BASED_OUTPATIENT_CLINIC_OR_DEPARTMENT_OTHER)
Admission: EM | Admit: 2020-01-23 | Discharge: 2020-01-23 | Disposition: A | Discharge status: Home / Self Care | Payer: Medicaid Other | Attending: Emergency Medicine | Admitting: Emergency Medicine

## 2020-01-23 ENCOUNTER — Other Ambulatory Visit: Payer: Self-pay

## 2020-01-23 ENCOUNTER — Encounter (HOSPITAL_BASED_OUTPATIENT_CLINIC_OR_DEPARTMENT_OTHER): Payer: Self-pay

## 2020-01-23 DIAGNOSIS — R05 Cough: Secondary | ICD-10-CM | POA: Insufficient documentation

## 2020-01-23 DIAGNOSIS — Z20822 Contact with and (suspected) exposure to covid-19: Secondary | ICD-10-CM | POA: Diagnosis not present

## 2020-01-23 DIAGNOSIS — Z5321 Procedure and treatment not carried out due to patient leaving prior to being seen by health care provider: Secondary | ICD-10-CM | POA: Insufficient documentation

## 2020-01-23 LAB — SARS CORONAVIRUS 2 BY RT PCR (HOSPITAL ORDER, PERFORMED IN ~~LOC~~ HOSPITAL LAB): SARS Coronavirus 2: NEGATIVE

## 2020-01-23 NOTE — ED Triage Notes (Signed)
Pt c/o flu like sx x 1-2 weeks-NAD-steady gait

## 2020-01-23 NOTE — ED Notes (Signed)
Re-evaluated. VSS> No sob.

## 2020-04-04 ENCOUNTER — Other Ambulatory Visit: Payer: Self-pay

## 2020-04-04 ENCOUNTER — Emergency Department (HOSPITAL_BASED_OUTPATIENT_CLINIC_OR_DEPARTMENT_OTHER)
Admission: EM | Admit: 2020-04-04 | Discharge: 2020-04-04 | Disposition: A | Discharge status: Home / Self Care | Payer: Medicaid Other | Attending: Emergency Medicine | Admitting: Emergency Medicine

## 2020-04-04 ENCOUNTER — Encounter (HOSPITAL_BASED_OUTPATIENT_CLINIC_OR_DEPARTMENT_OTHER): Payer: Self-pay

## 2020-04-04 DIAGNOSIS — J020 Streptococcal pharyngitis: Secondary | ICD-10-CM

## 2020-04-04 DIAGNOSIS — J029 Acute pharyngitis, unspecified: Secondary | ICD-10-CM | POA: Insufficient documentation

## 2020-04-04 DIAGNOSIS — Z20822 Contact with and (suspected) exposure to covid-19: Secondary | ICD-10-CM | POA: Diagnosis not present

## 2020-04-04 DIAGNOSIS — F17210 Nicotine dependence, cigarettes, uncomplicated: Secondary | ICD-10-CM | POA: Diagnosis not present

## 2020-04-04 LAB — RESPIRATORY PANEL BY RT PCR (FLU A&B, COVID)
Influenza A by PCR: NEGATIVE
Influenza B by PCR: NEGATIVE
SARS Coronavirus 2 by RT PCR: NEGATIVE

## 2020-04-04 LAB — GROUP A STREP BY PCR: Group A Strep by PCR: DETECTED — AB

## 2020-04-04 MED ORDER — DEXAMETHASONE 6 MG PO TABS
10.0000 mg | ORAL_TABLET | Freq: Once | ORAL | Status: AC
  Administered 2020-04-04: 10 mg via ORAL
  Filled 2020-04-04: qty 1

## 2020-04-04 MED ORDER — PENICILLIN V POTASSIUM 500 MG PO TABS: 500.0000 mg | ORAL_TABLET | Freq: Four times a day (QID) | ORAL | 0 refills | Status: AC

## 2020-04-04 NOTE — ED Provider Notes (Signed)
[facility] Emergency Department Provider Note MRN:  [id number]  Arrival date & time: 04/04/20     Chief Complaint   Sore Throat   History of Present Illness   Jenna Knight is a 29 y.o. year-old female with no pertinent past medical history presenting to the ED with chief complaint of sore throat.  Sore throat since yesterday, moderate in severity, difficulty swallowing due to the pain.  Also endorsing cough, mild dull frontal headache, general malaise.  Denies fever, no chest pain or shortness of breath, no abdominal pain, no rash.  Review of Systems  A problem-focused ROS was performed. Positive for sore throat.  Patient denies fever.  Patient's Health History    Past Medical History:  Diagnosis Date  . Migraines     History reviewed. No pertinent surgical history.  History reviewed. No pertinent family history.  Social History   Socioeconomic History  . Marital status: Single    Spouse name: Not on file  . Number of children: Not on file  . Years of education: Not on file  . Highest education level: Not on file  Occupational History  . Not on file  Tobacco Use  . Smoking status: Current Some Day Smoker    Types: Cigarettes  . Smokeless tobacco: Never Used  Vaping Use  . Vaping Use: Never used  Substance and Sexual Activity  . Alcohol use: Yes    Comment: occ  . Drug use: No  . Sexual activity: Not on file  Other Topics Concern  . Not on file  Social History Narrative  . Not on file   Social Determinants of Health   Financial Resource Strain:   . Difficulty of Paying Living Expenses: Not on file  Food Insecurity:   . Worried About Programme researcher, broadcasting/film/video in the Last Year: Not on file  . Ran Out of Food in the Last Year: Not on file  Transportation Needs:   . Lack of Transportation (Medical): Not on file  . Lack of Transportation (Non-Medical): Not on file  Physical Activity:   . Days of Exercise per Week: Not on file  .  Minutes of Exercise per Session: Not on file  Stress:   . Feeling of Stress : Not on file  Social Connections:   . Frequency of Communication with Friends and Family: Not on file  . Frequency of Social Gatherings with Friends and Family: Not on file  . Attends Religious Services: Not on file  . Active Member of Clubs or Organizations: Not on file  . Attends Banker Meetings: Not on file  . Marital Status: Not on file  Intimate Partner Violence:   . Fear of Current or Ex-Partner: Not on file  . Emotionally Abused: Not on file  . Physically Abused: Not on file  . Sexually Abused: Not on file     Physical Exam   Vitals:   04/04/20 0815 04/04/20 0818  BP:  121/80  Pulse: 98   Resp: 18   Temp: 98.5 F (36.9 C)   SpO2: 100%     CONSTITUTIONAL: Well-appearing, NAD NEURO:  Alert and oriented x 3, no focal deficits EYES:  eyes equal and reactive ENT/NECK:  no LAD, no JVD; mild erythema to the posterior oropharynx CARDIO: Regular rate, well-perfused, normal S1 and S2 PULM:  CTAB no wheezing or rhonchi GI/GU:  normal bowel sounds, non-distended, non-tender MSK/SPINE:  No gross deformities, no edema SKIN:  no rash, atraumatic PSYCH:  Appropriate speech and behavior  *Additional and/or pertinent findings included in MDM below  Diagnostic and Interventional Summary    EKG Interpretation  Date/Time:    Ventricular Rate:    PR Interval:    QRS Duration:   QT Interval:    QTC Calculation:   R Axis:     Text Interpretation:        Labs Reviewed  GROUP A STREP BY PCR  RESPIRATORY PANEL BY RT PCR (FLU A&B, COVID)    No orders to display    Medications  dexamethasone (DECADRON) tablet 10 mg (10 mg Oral Given 04/04/20 0100)     Procedures  /  Critical Care Procedures  ED Course and Medical Decision Making  I have reviewed the triage vital signs, the nursing notes, and pertinent available records from the EMR.  Listed above are laboratory and imaging  tests that I personally ordered, reviewed, and interpreted and then considered in my medical decision making (see below for details).  Pharyngitis, considering strep versus Covid.  No asymmetry or signs of more significant contiguous infection such as PTA or RPA.  Normal vital signs, well-appearing.  Fairly significant discomfort, will provide Decadron for symptomatic management.  Patient states that she is not pregnant and defers pregnancy testing today.  Will provide penicillin if strep test is positive, otherwise home with isolation precautions until she receives negative Covid test.     [name] was evaluated in Emergency Department on 04/04/2020 for the symptoms described in the history of present illness. She was evaluated in the context of the global COVID-19 pandemic, which necessitated consideration that the patient might be at risk for infection with the SARS-CoV-2 virus that causes COVID-19. Institutional protocols and algorithms that pertain to the evaluation of patients at risk for COVID-19 are in a state of rapid change based on information released by regulatory bodies including the CDC and federal and state organizations. These policies and algorithms were followed during the patient's care in the ED.   Elmer Sow. Pilar Plate, MD [facility] Emergency Medicine [facility] [email or web address]  Final Clinical Impressions(s) / ED Diagnoses     ICD-10-CM   1. Sore throat  J02.9     ED Discharge Orders    None       Discharge Instructions Discussed with and Provided to Patient:   Discharge Instructions   None       Sabas Sous, MD 04/04/20 [phone]

## 2020-04-04 NOTE — Discharge Instructions (Addendum)
You were evaluated in the Emergency Department and after careful evaluation, we did not find any emergent condition requiring admission or further testing in the hospital.  Your exam/testing today was overall reassuring.  Your strep throat test was positive.  You tested negative for Covid and negative for the flu.  Please take the antibiotic as directed.  Continue using Tylenol or Motrin at home for discomfort.  Please return to the Emergency Department if you experience any worsening of your condition.  Thank you for allowing [place] to be a part of your care.

## 2020-04-04 NOTE — ED Triage Notes (Signed)
Pt complaining of sore throat starting yesterday with cough, nausea, headache. States difficult to swallow d/t pain. Not vaccinated for COVID.
